# Patient Record
Sex: Female | Born: 2005 | Race: Black or African American | Hispanic: No | Marital: Single | Smoking: Never smoker
Health system: Southern US, Community
[De-identification: ages and names within clinical notes are randomized; demographics above are authoritative.]

## PROBLEM LIST (undated history)

## (undated) DIAGNOSIS — Z789 Other specified health status: Secondary | ICD-10-CM

## (undated) DIAGNOSIS — R7303 Prediabetes: Secondary | ICD-10-CM

## (undated) DIAGNOSIS — J40 Bronchitis, not specified as acute or chronic: Secondary | ICD-10-CM

## (undated) DIAGNOSIS — J45909 Unspecified asthma, uncomplicated: Secondary | ICD-10-CM

## (undated) HISTORY — DX: Other specified health status: Z78.9

## (undated) HISTORY — DX: Prediabetes: R73.03

## (undated) HISTORY — PX: NO PAST SURGERIES: SHX2092

---
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
## 2023-04-30 ENCOUNTER — Ambulatory Visit: Payer: Managed Care, Other (non HMO) | Admitting: Radiology

## 2023-06-05 ENCOUNTER — Ambulatory Visit (INDEPENDENT_AMBULATORY_CARE_PROVIDER_SITE_OTHER): Payer: Managed Care, Other (non HMO) | Admitting: Radiology

## 2023-06-05 ENCOUNTER — Encounter: Payer: Self-pay | Admitting: Radiology

## 2023-06-05 VITALS — BP 124/76 | HR 91 | Ht 67.5 in | Wt 282.2 lb

## 2023-06-05 DIAGNOSIS — K625 Hemorrhage of anus and rectum: Secondary | ICD-10-CM

## 2023-06-05 DIAGNOSIS — N920 Excessive and frequent menstruation with regular cycle: Secondary | ICD-10-CM | POA: Diagnosis not present

## 2023-06-05 NOTE — Progress Notes (Signed)
   Morgan Page 03/03/2006 [id number]   History:  18 y.o. G0 presents with c/o heavy periods x's 2-3 years. Periods started at age 41. Were very irregular for years. Now they are heavy and regular, changing a pad and a super tampon every 2-3 hours on day 2. Periods last 5 days. Some cramping. Has also had bright red rectal bleeding x's 5 years.Denies constipation or diarrhea. Concerned she has endometriosis. PCP rx'd OCPs and she was afraid to take them.   Gynecologic History Patient's last menstrual period was 05/18/2023. Period Cycle (Days): 28 Period Duration (Days): 5 Period Pattern: Regular Menstrual Flow: Heavy Menstrual Control: Thin pad, Maxi pad, Tampon Dysmenorrhea: (!) Mild Dysmenorrhea Symptoms: Cramping Contraception/Family planning: abstinence Sexually active: no   Obstetric History OB History  Gravida Para Term Preterm AB Living  0 0 0 0 0 0  SAB IAB Ectopic Multiple Live Births  0 0 0 0 0      The following portions of the patient's history were reviewed and updated as appropriate: allergies, current medications, past family history, past medical history, past social history, past surgical history, and problem list.  Review of Systems  All other systems reviewed and are negative.   Past medical history, past surgical history, family history and social history were all reviewed and documented in the EPIC chart.  Exam:  Vitals:   06/05/23 1418  BP: 124/76  Pulse: 91  SpO2: 99%  Weight: 282 lb 3.2 oz (128 kg)  Height: 5' 7.5" (1.715 m)   Body mass index is 43.55 kg/m.  Physical Exam Vitals and nursing note reviewed.  Constitutional:      Appearance: Normal appearance. She is obese.  HENT:     Head: Normocephalic.  Pulmonary:     Effort: Pulmonary effort is normal.  Abdominal:     General: Abdomen is flat. Bowel sounds are normal.     Palpations: Abdomen is soft.  Genitourinary:    General: Normal vulva.     Vagina: No vaginal discharge or  bleeding.     Uterus: Normal.      Adnexa: Right adnexa normal and left adnexa normal.  Neurological:     Mental Status: She is alert.  Psychiatric:        Mood and Affect: Mood normal.        Thought Content: Thought content normal.        Judgment: Judgment normal.      Raynelle Fanning, CMA present for exam  Assessment/Plan:   1. Menorrhagia with regular cycle (Primary) - CBC - Comp Met (CMET) - Iron, TIBC and Ferritin Panel - TSH - HgB A1c - Prolactin - FSH - LH  2. Rectal bleeding - Ambulatory referral to Gastroenterology    Will contact with results of labs and manage accordingly.     Arlie Solomons B WHNP-BC 2:39 PM 06/05/2023

## 2023-06-06 ENCOUNTER — Other Ambulatory Visit: Payer: Self-pay

## 2023-06-06 DIAGNOSIS — D649 Anemia, unspecified: Secondary | ICD-10-CM

## 2023-06-06 DIAGNOSIS — N920 Excessive and frequent menstruation with regular cycle: Secondary | ICD-10-CM

## 2023-06-06 LAB — COMPREHENSIVE METABOLIC PANEL
AG Ratio: 1.4 (calc) (ref 1.0–2.5)
ALT: 17 U/L (ref 5–32)
AST: 17 U/L (ref 12–32)
Albumin: 4.2 g/dL (ref 3.6–5.1)
Alkaline phosphatase (APISO): 63 U/L (ref 36–128)
BUN: 10 mg/dL (ref 7–20)
CO2: 25 mmol/L (ref 20–32)
Calcium: 9.5 mg/dL (ref 8.9–10.4)
Chloride: 105 mmol/L (ref 98–110)
Creat: 0.65 mg/dL (ref 0.50–0.96)
Globulin: 3.1 g/dL (ref 2.0–3.8)
Glucose, Bld: 94 mg/dL (ref 65–99)
Potassium: 4 mmol/L (ref 3.8–5.1)
Sodium: 138 mmol/L (ref 135–146)
Total Bilirubin: 0.3 mg/dL (ref 0.2–1.1)
Total Protein: 7.3 g/dL (ref 6.3–8.2)

## 2023-06-06 LAB — IRON,TIBC AND FERRITIN PANEL
%SAT: 9 % — ABNORMAL LOW (ref 15–45)
Ferritin: 10 ng/mL — ABNORMAL LOW (ref 16–154)
Iron: 35 ug/dL (ref 27–164)
TIBC: 395 ug/dL (ref 271–448)

## 2023-06-06 LAB — CBC
HCT: 37.6 % (ref 35.0–45.0)
Hemoglobin: 12.1 g/dL (ref 11.7–15.5)
MCH: 27.6 pg (ref 27.0–33.0)
MCHC: 32.2 g/dL (ref 32.0–36.0)
MCV: 85.8 fL (ref 80.0–100.0)
MPV: 10.6 fL (ref 7.5–12.5)
Platelets: 338 10*3/uL (ref 140–400)
RBC: 4.38 10*6/uL (ref 3.80–5.10)
RDW: 13.2 % (ref 11.0–15.0)
WBC: 3.5 10*3/uL — ABNORMAL LOW (ref 3.8–10.8)

## 2023-06-06 LAB — LUTEINIZING HORMONE: LH: 40.9 m[IU]/mL

## 2023-06-06 LAB — FOLLICLE STIMULATING HORMONE: FSH: 15.3 m[IU]/mL

## 2023-06-06 LAB — HEMOGLOBIN A1C
Hgb A1c MFr Bld: 5.5 %{Hb} (ref <5.7)
Mean Plasma Glucose: 111 mg/dL
eAG (mmol/L): 6.2 mmol/L

## 2023-06-06 LAB — TSH: TSH: 2.59 m[IU]/L

## 2023-06-06 LAB — PROLACTIN: Prolactin: 11.2 ng/mL

## 2023-06-06 MED ORDER — ACCRUFER 30 MG PO CAPS: 1.0000 | ORAL_CAPSULE | Freq: Two times a day (BID) | ORAL | 2 refills | Status: AC

## 2023-09-10 ENCOUNTER — Ambulatory Visit: Payer: Managed Care, Other (non HMO) | Admitting: Dermatology

## 2023-09-17 ENCOUNTER — Other Ambulatory Visit: Payer: Self-pay | Admitting: Radiology

## 2023-09-17 DIAGNOSIS — K625 Hemorrhage of anus and rectum: Secondary | ICD-10-CM

## 2023-09-25 ENCOUNTER — Ambulatory Visit: Admitting: Radiology

## 2024-01-22 ENCOUNTER — Encounter
Admission: RE | Disposition: A | Discharge status: Home / Self Care | Payer: Self-pay | Source: Home / Self Care | Attending: Gastroenterology

## 2024-01-22 ENCOUNTER — Ambulatory Visit: Admitting: Anesthesiology

## 2024-01-22 ENCOUNTER — Ambulatory Visit
Admission: RE | Admit: 2024-01-22 | Discharge: 2024-01-22 | Disposition: A | Discharge status: Home / Self Care | Attending: Gastroenterology | Admitting: Gastroenterology

## 2024-01-22 ENCOUNTER — Encounter: Payer: Self-pay | Admitting: Gastroenterology

## 2024-01-22 DIAGNOSIS — Z6841 Body Mass Index (BMI) 40.0 and over, adult: Secondary | ICD-10-CM | POA: Insufficient documentation

## 2024-01-22 DIAGNOSIS — K625 Hemorrhage of anus and rectum: Secondary | ICD-10-CM | POA: Diagnosis present

## 2024-01-22 DIAGNOSIS — E66813 Obesity, class 3: Secondary | ICD-10-CM | POA: Insufficient documentation

## 2024-01-22 HISTORY — DX: Prediabetes: R73.03

## 2024-01-22 HISTORY — PX: FLEXIBLE SIGMOIDOSCOPY: SHX5431

## 2024-01-22 LAB — POCT PREGNANCY, URINE
Preg Test, Ur: NEGATIVE
Preg Test, Ur: NEGATIVE

## 2024-01-22 SURGERY — SIGMOIDOSCOPY, FLEXIBLE
Anesthesia: General

## 2024-01-22 MED ORDER — PROPOFOL 500 MG/50ML IV EMUL
INTRAVENOUS | Status: DC | PRN
  Administered 2024-01-22: 165 ug/kg/min via INTRAVENOUS

## 2024-01-22 MED ORDER — MIDAZOLAM HCL (PF) 2 MG/2ML IJ SOLN
INTRAMUSCULAR | Status: DC | PRN
  Administered 2024-01-22: 2 mg via INTRAVENOUS

## 2024-01-22 MED ORDER — PROPOFOL 1000 MG/100ML IV EMUL
INTRAVENOUS | Status: AC
  Filled 2024-01-22: qty 100

## 2024-01-22 MED ORDER — PROPOFOL 10 MG/ML IV BOLUS
INTRAVENOUS | Status: DC | PRN
  Administered 2024-01-22: 60 mg via INTRAVENOUS

## 2024-01-22 MED ORDER — SODIUM CHLORIDE 0.9 % IV SOLN
INTRAVENOUS | Status: DC
  Administered 2024-01-22: 500 mL via INTRAVENOUS

## 2024-01-22 MED ORDER — DEXMEDETOMIDINE HCL IN NACL 200 MCG/50ML IV SOLN
INTRAVENOUS | Status: DC | PRN
  Administered 2024-01-22: 12 ug via INTRAVENOUS

## 2024-01-22 MED ORDER — GLYCOPYRROLATE 0.2 MG/ML IJ SOLN
INTRAMUSCULAR | Status: DC | PRN
  Administered 2024-01-22: .2 mg via INTRAVENOUS

## 2024-01-22 MED ORDER — LIDOCAINE HCL (CARDIAC) PF 100 MG/5ML IV SOSY
PREFILLED_SYRINGE | INTRAVENOUS | Status: DC | PRN
  Administered 2024-01-22: 100 mg via INTRAVENOUS

## 2024-01-22 MED ORDER — MIDAZOLAM HCL 2 MG/2ML IJ SOLN
INTRAMUSCULAR | Status: AC
  Filled 2024-01-22: qty 2

## 2024-01-22 NOTE — Op Note (Signed)
 [facility] Gastroenterology Patient Name: Morgan Page Procedure Date: 01/22/2024 9:13 AM MRN: [id number] Account #: [id number] Date of Birth: September 02, 2005 Admit Type: Outpatient Age: 18 Room: [facility] ENDO ROOM 1 Gender: Female Note Status: Finalized Instrument Name: Endoscope [id number] Procedure:             Flexible Sigmoidoscopy Indications:           Rectal hemorrhage Providers:             Corinn Jess Brooklyn MD, MD Referring MD:          No Local Md, MD (Referring MD) Medicines:             General Anesthesia Complications:         No immediate complications. Estimated blood loss: None. Procedure:             Pre-Anesthesia Assessment:                        - Prior to the procedure, a History and Physical was                         performed, and patient medications and allergies were                         reviewed. The patient is competent. The risks and                         benefits of the procedure and the sedation options and                         risks were discussed with the patient. All questions                         were answered and informed consent was obtained.                         Patient identification and proposed procedure were                         verified by the physician, the nurse, the                         anesthesiologist, the anesthetist and the technician                         in the pre-procedure area in the procedure room in the                         endoscopy suite. Mental Status Examination: alert and                         oriented. Airway Examination: normal oropharyngeal                         airway and neck mobility. Respiratory Examination:                         clear to auscultation. CV Examination: normal.  Prophylactic Antibiotics: The patient does not require                         prophylactic antibiotics. Prior Anticoagulants: The                         patient has taken no  anticoagulant or antiplatelet                         agents. ASA Grade Assessment: II - A patient with mild                         systemic disease. After reviewing the risks and                         benefits, the patient was deemed in satisfactory                         condition to undergo the procedure. The anesthesia                         plan was to use general anesthesia. Immediately prior                         to administration of medications, the patient was                         re-assessed for adequacy to receive sedatives. The                         heart rate, respiratory rate, oxygen saturations,                         blood pressure, adequacy of pulmonary ventilation, and                         response to care were monitored throughout the                         procedure. The physical status of the patient was                         re-assessed after the procedure.                        After obtaining informed consent, the scope was passed                         under direct vision. The Endoscope was introduced                         through the anus and advanced to the the descending                         colon. The flexible sigmoidoscopy was accomplished                         without difficulty. The patient tolerated the  procedure well. The quality of the bowel preparation                         was good. Findings:      The perianal and digital rectal examinations were normal. Pertinent       negatives include normal sphincter tone and no palpable rectal lesions.      The entire examined colon appeared normal.      Normal retroflexion in the rectum Impression:            - The entire examined colon is normal.                        - No specimens collected. Recommendation:        - Discharge patient to home (with escort).                        - Resume previous diet today.                        - Continue present  medications. Procedure Code(s):     --- Professional ---                        [phone], Sigmoidoscopy, flexible; diagnostic, including                         collection of specimen(s) by brushing or washing, when                         performed (separate procedure) Diagnosis Code(s):     --- Professional ---                        K62.5, Hemorrhage of anus and rectum CPT copyright 2022 American Medical Association. All rights reserved. The codes documented in this report are preliminary and upon coder review may  be revised to meet current compliance requirements. Dr. Corinn Brooklyn Corinn Jess Brooklyn MD, MD 01/22/2024 9:28:56 AM This report has been signed electronically. Number of Addenda: 0 Note Initiated On: 01/22/2024 9:13 AM Total Procedure Duration: 0 hours 3 minutes 3 seconds  Estimated Blood Loss:  Estimated blood loss: none.      [facility]

## 2024-01-22 NOTE — Anesthesia Procedure Notes (Signed)
 Procedure Name: General with mask airway Date/Time: 01/22/2024 9:19 AM  Performed by: Ledora Duncan, CRNAPre-anesthesia Checklist: Patient identified, Emergency Drugs available, Suction available and Patient being monitored Patient Re-evaluated:Patient Re-evaluated prior to induction Oxygen Delivery Method: Simple face mask Induction Type: IV induction Placement Confirmation: positive ETCO2 and breath sounds checked- equal and bilateral Dental Injury: Teeth and Oropharynx as per pre-operative assessment

## 2024-01-22 NOTE — Anesthesia Postprocedure Evaluation (Signed)
 Anesthesia Post Note  Patient: [employer]  Procedure(s) Performed: [facility]ST[facility] SIDE  Patient location during evaluation: PACU Anesthesia Type: General Level of consciousness: awake Pain management: satisfactory to patient Vital Signs Assessment: post-procedure vital signs reviewed and stable Respiratory status: spontaneous breathing Cardiovascular status: stable Anesthetic complications: no   No notable events documented.   Last Vitals:  Vitals:   01/22/24 0939 01/22/24 0949  BP: (!) 128/95 134/82  Pulse: 61 (!) 52  Resp: (!) 21 16  Temp:    SpO2: 100% 100%    Last Pain:  Vitals:   01/22/24 0949  TempSrc:   PainSc: 0-No pain                 VAN STAVEREN,Jibri Schriefer

## 2024-01-22 NOTE — H&P (Signed)
 Morgan JONELLE Brooklyn, MD [facility], DHIP [address]  [place], [place] [address]  Main: [phone] Fax:  [phone] Pager: [phone]   Primary Care Physician:  Pcp, No Primary Gastroenterologist:  Dr. Corinn JONELLE Page  Pre-Procedure History & Physical: HPI:  Morgan Page is a 18 y.o. female is here for an flexible sigmoidoscopy.   Past Medical History:  Diagnosis Date   No known health problems    Pre-diabetes    Prediabetes     Past Surgical History:  Procedure Laterality Date   NO PAST SURGERIES      Prior to Admission medications   Medication Sig Start Date End Date Taking? Authorizing Provider  levocetirizine (XYZAL ALLERGY 24HR) 5 MG tablet  01/05/23  Yes [provider]  meloxicam (MOBIC) 15 MG tablet  05/27/22  Yes [provider]  loratadine (CLARITIN) 10 MG tablet Take by mouth. Patient not taking: Reported on 06/05/2023    [provider]  metFORMIN (GLUCOPHAGE) 500 MG tablet Take by mouth. Patient not taking: Reported on 01/22/2024 06/22/19   [provider]  NAPROXEN PO Take by mouth.    [provider]  norethindrone-ethinyl estradiol (JUNEL FE 1/20) 1-20 MG-MCG tablet Take 1 tablet by mouth daily. Patient not taking: Reported on 06/05/2023 05/21/19   Morgan Garnette BIRCH, MD    Allergies as of 12/22/2023   (No Known Allergies)    Family History  Problem Relation Age of Onset   Asthma Mother        as a child, not currently   Myasthenia gravis Mother    Breast cancer Maternal Aunt    Breast cancer Paternal Aunt    Diabetes Maternal Grandmother    Diabetes Maternal Grandfather     Social History   Socioeconomic History   Marital status: Single    Spouse name: Not on file   Number of children: Not on file   Years of education: Not on file   Highest education level: Not on file  Occupational History   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  Tobacco Use   Smoking status: Never     Passive exposure: Past   Smokeless tobacco: Never  Vaping Use   Vaping status: Never Used  Substance and Sexual Activity   Alcohol use: No   Drug use: No   Sexual activity: Never    Partners: Male    Birth control/protection: Abstinence    Comment: menarche 18yo  Other Topics Concern   Not on file  Social History Narrative   Not on file   Social Drivers of Health   Financial Resource Strain: Not on file  Food Insecurity: Not on file  Transportation Needs: Not on file  Physical Activity: Not on file  Stress: Not on file  Social Connections: Not on file  Intimate Partner Violence: Not on file    Review of Systems: See HPI, otherwise negative ROS  Physical Exam: BP (!) 143/92   Pulse 62   Temp (!) 97 F (36.1 C) (Temporal)   Resp 16   Ht 5' 7.5 (1.715 m)   Wt 125.2 kg   LMP 12/31/2023 Comment: PREGNANCY TEST NEGATIVE  SpO2 97%   BMI 42.59 kg/m  General:   Alert,  pleasant and cooperative in NAD Head:  Normocephalic and atraumatic. Neck:  Supple; no masses or thyromegaly. Lungs:  Clear throughout to auscultation.    Heart:  Regular rate and rhythm. Abdomen:  Soft, nontender and nondistended. Normal bowel sounds, without guarding,  and without rebound.   Neurologic:  Alert and  oriented x4;  grossly normal neurologically.  Impression/Plan: Morgan Page is here for an flexible sigmoidoscopy to be performed for rectal bleeding  Risks, benefits, limitations, and alternatives regarding  flexible sigmoidoscopy have been reviewed with the patient.  Questions have been answered.  All parties agreeable.   Morgan Brooklyn, MD  01/22/2024, 8:55 AM

## 2024-01-22 NOTE — Transfer of Care (Signed)
 Immediate Anesthesia Transfer of Care Note  Patient: [employer]  Procedure(s) Performed: [facility]ST[facility] SIDE  Patient Location: Endoscopy Unit  Anesthesia Type:General  Level of Consciousness: drowsy and patient cooperative  Airway & Oxygen Therapy: Patient Spontanous Breathing and Patient connected to face mask oxygen  Post-op Assessment: Report given to RN and Post -op Vital signs reviewed and stable  Post vital signs: Reviewed and stable  Last Vitals:  Vitals Value Taken Time  BP    Temp    Pulse    Resp    SpO2      Last Pain:  Vitals:   01/22/24 0929  TempSrc: Tympanic  PainSc:          Complications: No notable events documented.

## 2024-01-22 NOTE — Anesthesia Preprocedure Evaluation (Signed)
 Anesthesia Evaluation  Patient identified by MRN, date of birth, ID band Patient awake    Reviewed: Allergy & Precautions, NPO status , Patient's Chart, lab work & pertinent test results  Airway Mallampati: II  TM Distance: <3 FB Neck ROM: full    Dental  (+) Teeth Intact   Pulmonary neg pulmonary ROS   Pulmonary exam normal breath sounds clear to auscultation       Cardiovascular Exercise Tolerance: Good negative cardio ROS Normal cardiovascular exam Rhythm:Regular Rate:Normal     Neuro/Psych negative neurological ROS  negative psych ROS   GI/Hepatic negative GI ROS, Neg liver ROS,,,  Endo/Other  negative endocrine ROS  Class 3 obesity  Renal/GU negative Renal ROS  negative genitourinary   Musculoskeletal   Abdominal  (+) + obese  Peds negative pediatric ROS (+)  Hematology negative hematology ROS (+)   Anesthesia Other Findings Past Medical History: No date: No known health problems No date: Prediabetes  Past Surgical History: No date: NO PAST SURGERIES  BMI    Body Mass Index: 42.59 kg/m      Reproductive/Obstetrics negative OB ROS                              Anesthesia Physical Anesthesia Plan  ASA: 2  Anesthesia Plan: General   Post-op Pain Management:    Induction:   PONV Risk Score and Plan: Propofol infusion and TIVA  Airway Management Planned: Natural Airway and Nasal Cannula  Additional Equipment:   Intra-op Plan:   Post-operative Plan:   Informed Consent: I have reviewed the patients History and Physical, chart, labs and discussed the procedure including the risks, benefits and alternatives for the proposed anesthesia with the patient or authorized representative who has indicated his/her understanding and acceptance.     Dental Advisory Given  Plan Discussed with: CRNA  Anesthesia Plan Comments:         Anesthesia Quick Evaluation
